# Patient Record
Sex: Male | Born: 1990 | Race: Black or African American | Hispanic: No | Marital: Single | Smoking: Current every day smoker
Health system: Southern US, Community
[De-identification: ages and names within clinical notes are randomized; demographics above are authoritative.]

## PROBLEM LIST (undated history)

## (undated) DIAGNOSIS — J45909 Unspecified asthma, uncomplicated: Secondary | ICD-10-CM

## (undated) DIAGNOSIS — M5020 Other cervical disc displacement, unspecified cervical region: Secondary | ICD-10-CM

---
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
## 2011-07-22 ENCOUNTER — Emergency Department: Payer: Self-pay | Admitting: *Deleted

## 2014-08-01 ENCOUNTER — Emergency Department: Payer: BLUE CROSS/BLUE SHIELD

## 2014-08-01 ENCOUNTER — Encounter: Payer: Self-pay | Admitting: Emergency Medicine

## 2014-08-01 ENCOUNTER — Emergency Department
Admission: EM | Admit: 2014-08-01 | Discharge: 2014-08-01 | Disposition: A | Discharge status: Home Health | Payer: BLUE CROSS/BLUE SHIELD | Attending: Emergency Medicine | Admitting: Emergency Medicine

## 2014-08-01 DIAGNOSIS — Y92410 Unspecified street and highway as the place of occurrence of the external cause: Secondary | ICD-10-CM | POA: Insufficient documentation

## 2014-08-01 DIAGNOSIS — S63619A Unspecified sprain of unspecified finger, initial encounter: Secondary | ICD-10-CM

## 2014-08-01 DIAGNOSIS — Y998 Other external cause status: Secondary | ICD-10-CM | POA: Diagnosis not present

## 2014-08-01 DIAGNOSIS — S63614A Unspecified sprain of right ring finger, initial encounter: Secondary | ICD-10-CM | POA: Diagnosis not present

## 2014-08-01 DIAGNOSIS — Y9389 Activity, other specified: Secondary | ICD-10-CM | POA: Diagnosis not present

## 2014-08-01 DIAGNOSIS — S3991XA Unspecified injury of abdomen, initial encounter: Secondary | ICD-10-CM | POA: Diagnosis not present

## 2014-08-01 DIAGNOSIS — S6991XA Unspecified injury of right wrist, hand and finger(s), initial encounter: Secondary | ICD-10-CM | POA: Diagnosis present

## 2014-08-01 LAB — URINALYSIS COMPLETE WITH MICROSCOPIC (ARMC ONLY)
Bacteria, UA: NONE SEEN
Bilirubin Urine: NEGATIVE
GLUCOSE, UA: NEGATIVE mg/dL
Hgb urine dipstick: NEGATIVE
Ketones, ur: NEGATIVE mg/dL
Leukocytes, UA: NEGATIVE
Nitrite: NEGATIVE
Protein, ur: NEGATIVE mg/dL
Specific Gravity, Urine: 1.026 (ref 1.005–1.030)
pH: 6 (ref 5.0–8.0)

## 2014-08-01 MED ORDER — TRAMADOL HCL 50 MG PO TABS
ORAL_TABLET | ORAL | Status: AC
  Administered 2014-08-01: 50 mg via ORAL
  Filled 2014-08-01: qty 1

## 2014-08-01 MED ORDER — TRAMADOL HCL 50 MG PO TABS: 50.0000 mg | ORAL_TABLET | Freq: Four times a day (QID) | ORAL | Status: DC | PRN

## 2014-08-01 MED ORDER — IBUPROFEN 800 MG PO TABS
ORAL_TABLET | ORAL | Status: AC
  Administered 2014-08-01: 800 mg via ORAL
  Filled 2014-08-01: qty 1

## 2014-08-01 MED ORDER — METHOCARBAMOL 500 MG PO TABS
1000.0000 mg | ORAL_TABLET | Freq: Once | ORAL | Status: AC
  Administered 2014-08-01: 1000 mg via ORAL

## 2014-08-01 MED ORDER — METHOCARBAMOL 500 MG PO TABS
ORAL_TABLET | ORAL | Status: AC
  Administered 2014-08-01: 1000 mg via ORAL
  Filled 2014-08-01: qty 2

## 2014-08-01 MED ORDER — METHOCARBAMOL 750 MG PO TABS: 1500.0000 mg | ORAL_TABLET | Freq: Four times a day (QID) | ORAL | Status: DC

## 2014-08-01 MED ORDER — IBUPROFEN 800 MG PO TABS: 800.0000 mg | ORAL_TABLET | Freq: Three times a day (TID) | ORAL | Status: DC | PRN

## 2014-08-01 MED ORDER — TRAMADOL HCL 50 MG PO TABS
50.0000 mg | ORAL_TABLET | Freq: Once | ORAL | Status: AC
  Administered 2014-08-01: 50 mg via ORAL

## 2014-08-01 MED ORDER — IBUPROFEN 800 MG PO TABS
800.0000 mg | ORAL_TABLET | Freq: Once | ORAL | Status: AC
Start: 2014-08-01 — End: 2014-08-01
  Administered 2014-08-01: 800 mg via ORAL
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
## 2014-08-01 NOTE — ED Notes (Signed)
Pt reports taking Advil today around noon. Pt reports that it helped with the swelling of right hand 4th digit. And "mildly" helped the rest of the pain he reports

## 2014-08-01 NOTE — Discharge Instructions (Signed)
Finger Sprain  A finger sprain is a tear in one of the strong, fibrous tissues that connect the bones (ligaments) in your finger. The severity of the sprain depends on how much of the ligament is torn. The tear can be either partial or complete.  CAUSES   Often, sprains are a result of a fall or accident. If you extend your hands to catch an object or to protect yourself, the force of the impact causes the fibers of your ligament to stretch too much. This excess tension causes the fibers of your ligament to tear.  SYMPTOMS   You may have some loss of motion in your finger. Other symptoms include:   Bruising.   Tenderness.   Swelling.  DIAGNOSIS   In order to diagnose finger sprain, your caregiver will physically examine your finger or thumb to determine how torn the ligament is. Your caregiver may also suggest an X-ray exam of your finger to make sure no bones are broken.  TREATMENT   If your ligament is only partially torn, treatment usually involves keeping the finger in a fixed position (immobilization) for a short period. To do this, your caregiver will apply a bandage, cast, or splint to keep your finger from moving until it heals. For a partially torn ligament, the healing process usually takes 2 to 3 weeks.  If your ligament is completely torn, you may need surgery to reconnect the ligament to the bone. After surgery a cast or splint will be applied and will need to stay on your finger or thumb for 4 to 6 weeks while your ligament heals.  HOME CARE INSTRUCTIONS   Keep your injured finger elevated, when possible, to decrease swelling.   To ease pain and swelling, apply ice to your joint twice a day, for 2 to 3 days:   Put ice in a plastic bag.   Place a towel between your skin and the bag.   Leave the ice on for 15 minutes.   Only take over-the-counter or prescription medicine for pain as directed by your caregiver.   Do not wear rings on your injured finger.   Do not leave your finger unprotected  until pain and stiffness go away (usually 3 to 4 weeks).   Do not allow your cast or splint to get wet. Cover your cast or splint with a plastic bag when you shower or bathe. Do not swim.   Your caregiver may suggest special exercises for you to do during your recovery to prevent or limit permanent stiffness.  SEEK IMMEDIATE MEDICAL CARE IF:   Your cast or splint becomes damaged.   Your pain becomes worse rather than better.  MAKE SURE YOU:   Understand these instructions.   Will watch your condition.   Will get help right away if you are not doing well or get worse.  Document Released: 04/03/2004 Document Revised: 05/19/2011 Document Reviewed: 10/28/2010  [employer] Patient Information 2015 [employer]. This information is not intended to replace advice given to you by your health care provider. Make sure you discuss any questions you have with your health care provider.
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
## 2015-03-14 ENCOUNTER — Emergency Department: Payer: BLUE CROSS/BLUE SHIELD

## 2015-03-14 ENCOUNTER — Encounter: Payer: Self-pay | Admitting: Emergency Medicine

## 2015-03-14 ENCOUNTER — Emergency Department
Admission: EM | Admit: 2015-03-14 | Discharge: 2015-03-14 | Disposition: A | Discharge status: Home / Self Care | Payer: BLUE CROSS/BLUE SHIELD | Attending: Emergency Medicine | Admitting: Emergency Medicine

## 2015-03-14 DIAGNOSIS — Y9339 Activity, other involving climbing, rappelling and jumping off: Secondary | ICD-10-CM | POA: Insufficient documentation

## 2015-03-14 DIAGNOSIS — Y9289 Other specified places as the place of occurrence of the external cause: Secondary | ICD-10-CM | POA: Insufficient documentation

## 2015-03-14 DIAGNOSIS — Y998 Other external cause status: Secondary | ICD-10-CM | POA: Insufficient documentation

## 2015-03-14 DIAGNOSIS — W228XXA Striking against or struck by other objects, initial encounter: Secondary | ICD-10-CM | POA: Insufficient documentation

## 2015-03-14 DIAGNOSIS — S90112A Contusion of left great toe without damage to nail, initial encounter: Secondary | ICD-10-CM | POA: Insufficient documentation

## 2015-03-14 DIAGNOSIS — S99922A Unspecified injury of left foot, initial encounter: Secondary | ICD-10-CM | POA: Diagnosis present

## 2015-03-14 DIAGNOSIS — S9032XA Contusion of left foot, initial encounter: Secondary | ICD-10-CM

## 2015-03-14 MED ORDER — TRAMADOL HCL 50 MG PO TABS
50.0000 mg | ORAL_TABLET | Freq: Four times a day (QID) | ORAL | Status: DC | PRN
Start: 2015-03-14 — End: 2019-05-17

## 2015-03-14 MED ORDER — MELOXICAM 15 MG PO TABS: 15.0000 mg | ORAL_TABLET | Freq: Every day | ORAL | Status: DC

## 2015-03-14 NOTE — ED Notes (Signed)
Informed patient to follow up with Dr. Orland Jarredroxler if not improving.

## 2015-03-14 NOTE — Discharge Instructions (Signed)

## 2015-03-14 NOTE — ED Provider Notes (Signed)
[facility] Emergency Department Provider Note ____________________________________________  Time seen: Approximately 7:23 PM  I have reviewed the triage vital signs and the nursing notes.   HISTORY  Chief Complaint Foot Pain   HPI Cesar Mann is a 25 y.o. male presents to the emergency department for evaluation of left foot pain. He states that he was trying to climb up into the back of a truck while helping his grandparents move and he hit his foot on the bed of the truck causing his left great toe to "bend funny." He states that he has applied a compression bandage and elevated for the past 2 days without relief. He has not been taking any type of pain medications. Movement is the only thing that makes the pain worse.   History reviewed. No pertinent past medical history.  There are no active problems to display for this patient.   History reviewed. No pertinent past surgical history.  Current Outpatient Rx  Name  Route  Sig  Dispense  Refill  . meloxicam (MOBIC) 15 MG tablet   Oral   Take 1 tablet (15 mg total) by mouth daily.   30 tablet   2   . traMADol (ULTRAM) 50 MG tablet   Oral   Take 1 tablet (50 mg total) by mouth every 6 (six) hours as needed.   9 tablet   0     Allergies Sulfa antibiotics  No family history on file.  Social History Social History  Substance Use Topics  . Smoking status: Never Smoker   . Smokeless tobacco: None  . Alcohol Use: No    Review of Systems Constitutional: Normal appetite Eyes: No visual changes. ENT: Normal hearing, no bleeding, denies sore throat. Cardiovascular: Negative for chest pain. Respiratory: Negative for shortness of breath. Gastrointestinal: Negative for for abdominal pain Genitourinary: Negative for dysuria. Musculoskeletal: Positive for pain in the right great toe Skin: Positive for bruising and swelling to the left great Neurological: Negative for headaches. Negative for  focal weakness or numbness.. 10-point ROS otherwise negative.  ____________________________________________   PHYSICAL EXAM:  VITAL SIGNS: ED Triage Vitals  Enc Vitals Group     BP 03/14/15 1755 139/78 mmHg     Pulse Rate 03/14/15 1755 86     Resp 03/14/15 1755 16     Temp 03/14/15 1755 98.8 F (37.1 C)     Temp Source 03/14/15 1755 Oral     SpO2 03/14/15 1755 97 %     Weight 03/14/15 1755 210 lb (95.255 kg)     Height 03/14/15 1755 5\' 11"  (1.803 m)     Head Cir --      Peak Flow --      Pain Score 03/14/15 1755 8     Pain Loc --      Pain Edu? --      Excl. in GC? --     Constitutional: Alert and oriented. Well appearing and in no acute distress. Eyes: Conjunctivae are normal. PERRL. EOMI. Head: Atraumatic. Nose: No congestion/rhinnorhea. Mouth/Throat: Mucous membranes are moist.  Oropharynx non-erythematous. Neck: No stridor.  Cardiovascular: Normal rate, regular rhythm. Grossly normal heart sounds.  Good peripheral circulation. Respiratory: Normal respiratory effort.  No retractions. Lungs CTAB. Gastrointestinal: Soft and nontender. No distention. No abdominal bruits. Genitourinary: Soft Musculoskeletal: Contusion and swelling noted at the MCP of the left great toe. Neurologic:  Normal speech and language. No gross focal neurologic deficits are appreciated. Speech is normal. No gait instability. GCS:  15. Skin:  Skin is warm, dry and intact. No rash noted. Psychiatric: Mood and affect are normal. Speech, behavior, and judgement are normal.  ____________________________________________   LABS (all labs ordered are listed, but only abnormal results are displayed)  Labs Reviewed - No data to display ____________________________________________  EKG   ____________________________________________  RADIOLOGY  Left foot negative for acute bony abnormality.  I, Kem Boroughsari Rhen Kawecki, personally viewed and evaluated these images (plain radiographs) as part of my medical  decision making, as well as reviewing the written report by the radiologist.  ____________________________________________   PROCEDURES  Procedure(s) performed: Great toe and left second toe buddy taped together by ER tech. Neurovascularly intact post-application.  Critical Care performed: No  ____________________________________________   INITIAL IMPRESSION / ASSESSMENT AND PLAN / ED COURSE  Pertinent labs & imaging results that were available during my care of the patient were reviewed by me and considered in my medical decision making (see chart for details).  Patient was instructed to follow-up with Dr. Orland Jarredroxler for symptoms that are not improving over the week. He was advised to ice and elevate the foot for the next few days. He was advised to take the meloxicam and tramadol as prescribed. He was advised to return to the emergency department for symptoms that change or worsen if he is unable schedule appointment. ____________________________________________   FINAL CLINICAL IMPRESSION(S) / ED DIAGNOSES  Final diagnoses:  Foot contusion, left, initial encounter      Chinita PesterCari B Oyinkansola Truax, FNP 03/14/15 1929  Rockne MenghiniAnne-Caroline Norman, MD 03/14/15 2311

## 2015-03-14 NOTE — ED Notes (Signed)
Pt reports trying to climb on something a few days ago, reports left foot pain continues. Pt with swelling noted to left big toe.

## 2015-04-24 ENCOUNTER — Emergency Department
Admission: EM | Admit: 2015-04-24 | Discharge: 2015-04-25 | Disposition: A | Discharge status: Home / Self Care | Payer: BLUE CROSS/BLUE SHIELD | Attending: Emergency Medicine | Admitting: Emergency Medicine

## 2015-04-24 ENCOUNTER — Encounter: Payer: Self-pay | Admitting: Emergency Medicine

## 2015-04-24 DIAGNOSIS — K529 Noninfective gastroenteritis and colitis, unspecified: Secondary | ICD-10-CM | POA: Insufficient documentation

## 2015-04-24 DIAGNOSIS — R112 Nausea with vomiting, unspecified: Secondary | ICD-10-CM | POA: Diagnosis present

## 2015-04-24 DIAGNOSIS — Z791 Long term (current) use of non-steroidal anti-inflammatories (NSAID): Secondary | ICD-10-CM | POA: Insufficient documentation

## 2015-04-24 MED ORDER — ONDANSETRON HCL 4 MG PO TABS: 4.0000 mg | ORAL_TABLET | Freq: Every day | ORAL | Status: AC | PRN

## 2015-04-24 MED ORDER — ONDANSETRON 4 MG PO TBDP
4.0000 mg | ORAL_TABLET | Freq: Once | ORAL | Status: AC
  Administered 2015-04-25: 4 mg via ORAL
  Filled 2015-04-24: qty 1

## 2015-04-24 NOTE — ED Notes (Signed)
Pt arrived to the ED for complaints of vomiting and diarrhea for 2 days. Pt is AOx4 in no apparent distress.

## 2015-04-25 NOTE — ED Provider Notes (Signed)
[facility] Emergency Department Provider Note  ____________________________________________  Time seen: Approximately 12:04 AM  I have reviewed the triage vital signs and the nursing notes.   HISTORY  Chief Complaint Emesis and Diarrhea    HPI Cesar Mann is a 25 y.o. male who presents with acute nausea, vomiting, diarrhea starting last night. Has not vomited in over 6 hours. Tolerating by mouth fluids. No headache. No fevers or chills. No abdominal pain. No bleeding correct him. No chest pain or shortness of breath. Other family members have similar symptoms.   History reviewed. No pertinent past medical history.  There are no active problems to display for this patient.   History reviewed. No pertinent past surgical history.  Current Outpatient Rx  Name  Route  Sig  Dispense  Refill  . meloxicam (MOBIC) 15 MG tablet   Oral   Take 1 tablet (15 mg total) by mouth daily.   30 tablet   2   . ondansetron (ZOFRAN) 4 MG tablet   Oral   Take 1 tablet (4 mg total) by mouth daily as needed for nausea or vomiting.   10 tablet   1   . traMADol (ULTRAM) 50 MG tablet   Oral   Take 1 tablet (50 mg total) by mouth every 6 (six) hours as needed.   9 tablet   0     Allergies Sulfa antibiotics  History reviewed. No pertinent family history.  Social History Social History  Substance Use Topics  . Smoking status: Never Smoker   . Smokeless tobacco: None  . Alcohol Use: No    Review of Systems Constitutional: No fever/chills Eyes: No visual changes. ENT: No sore throat. Cardiovascular: Denies chest pain. Respiratory: Denies shortness of breath. Gastrointestinal: per HPI Genitourinary: Negative for dysuria. Musculoskeletal: Negative for back pain. Skin: Negative for rash. Neurological: Negative for headaches, focal weakness or numbness. 10-point ROS otherwise negative.  ____________________________________________   PHYSICAL  EXAM:  VITAL SIGNS: ED Triage Vitals  Enc Vitals Group     BP 04/24/15 2218 127/66 mmHg     Pulse --      Resp 04/24/15 2218 18     Temp 04/24/15 2218 98.1 F (36.7 C)     Temp Source 04/24/15 2218 Oral     SpO2 04/24/15 2218 100 %     Weight 04/24/15 2218 205 lb (92.987 kg)     Height 04/24/15 2218  (1.803 m)     Head Cir --      Peak Flow --      Pain Score 04/24/15 2219 0     Pain Loc --      Pain Edu? --      Excl. in GC? --     Constitutional: Alert and oriented. Well appearing and in no acute distress. Eyes: Conjunctivae are normal. PERRL. EOMI. Ears:  Clear with normal landmarks. No erythema. Head: Atraumatic. Nose: No congestion/rhinnorhea. Mouth/Throat: Mucous membranes are moist.  Oropharynx non-erythematous. No lesions. Neck:  Supple.  No adenopathy.   Cardiovascular: Normal rate, regular rhythm. Grossly normal heart sounds.  Good peripheral circulation. Respiratory: Normal respiratory effort.  No retractions. Lungs CTAB. Gastrointestinal: Soft and nontender. No distention. No abdominal bruits. No CVA tenderness. Musculoskeletal: Nml ROM of upper and lower extremity joints. Neurologic:  Normal speech and language. No gross focal neurologic deficits are appreciated. No gait instability. Skin:  Skin is warm, dry and intact. No rash noted. Psychiatric: Mood and affect are normal. Speech  and behavior are normal.  ____________________________________________   LABS (all labs ordered are listed, but only abnormal results are displayed)  Labs Reviewed - No data to display ____________________________________________  EKG   ____________________________________________  RADIOLOGY   ____________________________________________   PROCEDURES  Procedure(s) performed: None  Critical Care performed: No  ____________________________________________   INITIAL IMPRESSION / ASSESSMENT AND PLAN / ED COURSE  Pertinent labs & imaging results that were  available during my care of the patient were reviewed by me and considered in my medical decision making (see chart for details).  25 year old male with symptoms of gastroenteritis over the last 24 hours. Symptoms improving. Tolerating liquids. Given Zofran as needed. Also important note. He should continue to improve, or return to the emergency room as needed. Discussed bland diet until symptoms resolve. ____________________________________________   FINAL CLINICAL IMPRESSION(S) / ED DIAGNOSES  Final diagnoses:  Gastroenteritis      Ignacia Bayley, PA-C 04/25/15 0006  Sharman Cheek, MD 04/29/15 [phone]

## 2015-04-25 NOTE — Discharge Instructions (Signed)
Continued to stay on a liquid diet, mostly soups, toast until symptoms resolve. Stay away from dairy. Use nausea medicine as needed.

## 2015-06-18 ENCOUNTER — Encounter: Payer: Self-pay | Admitting: Emergency Medicine

## 2015-06-18 ENCOUNTER — Emergency Department
Admission: EM | Admit: 2015-06-18 | Discharge: 2015-06-18 | Disposition: A | Discharge status: Home / Self Care | Payer: BLUE CROSS/BLUE SHIELD | Attending: Emergency Medicine | Admitting: Emergency Medicine

## 2015-06-18 DIAGNOSIS — J45909 Unspecified asthma, uncomplicated: Secondary | ICD-10-CM | POA: Diagnosis not present

## 2015-06-18 DIAGNOSIS — R509 Fever, unspecified: Secondary | ICD-10-CM | POA: Diagnosis present

## 2015-06-18 DIAGNOSIS — J111 Influenza due to unidentified influenza virus with other respiratory manifestations: Secondary | ICD-10-CM | POA: Diagnosis not present

## 2015-06-18 HISTORY — DX: Unspecified asthma, uncomplicated: J45.909

## 2015-06-18 MED ORDER — PROMETHAZINE-DM 6.25-15 MG/5ML PO SYRP: 5.0000 mL | ORAL_SOLUTION | Freq: Four times a day (QID) | ORAL | Status: DC | PRN

## 2015-06-18 MED ORDER — OSELTAMIVIR PHOSPHATE 75 MG PO CAPS: 75.0000 mg | ORAL_CAPSULE | Freq: Two times a day (BID) | ORAL | Status: DC

## 2015-06-18 NOTE — ED Provider Notes (Signed)
[facility] Emergency Department Provider Note  ____________________________________________  Time seen: Approximately 12:17 PM  I have reviewed the triage vital signs and the nursing notes.   HISTORY  Chief Complaint Nasal Congestion and Fever    HPI Cesar Mann is a 25 y.o. male , NAD, presents to the emergency department with 3 day history of flulike symptoms. States had onset of fever, chills, body aches and nasal congestion as of Friday evening. States his entire family has had influenza and treated with Tamiflu over the last week. Notes he has had significantly decreased appetite but has been able to drink water and Gatorade without any issue. States he took 2 tablets of Tamiflu on Friday but none since that time. Denies abdominal pain, nausea, vomiting, diarrhea. Has not had any chest pain or back pain. No shortness of breath or wheezing. Has had some cough and chest congestion beginning over the last 24 hours.    Past Medical History  Diagnosis Date  . Asthma     There are no active problems to display for this patient.   History reviewed. No pertinent past surgical history.  Current Outpatient Rx  Name  Route  Sig  Dispense  Refill  . meloxicam (MOBIC) 15 MG tablet   Oral   Take 1 tablet (15 mg total) by mouth daily.   30 tablet   2   . ondansetron (ZOFRAN) 4 MG tablet   Oral   Take 1 tablet (4 mg total) by mouth daily as needed for nausea or vomiting.   10 tablet   1   . oseltamivir (TAMIFLU) 75 MG capsule   Oral   Take 1 capsule (75 mg total) by mouth 2 (two) times daily.   10 capsule   0   . promethazine-dextromethorphan (PROMETHAZINE-DM) 6.25-15 MG/5ML syrup   Oral   Take 5 mLs by mouth 4 (four) times daily as needed for cough.   118 mL   0   . traMADol (ULTRAM) 50 MG tablet   Oral   Take 1 tablet (50 mg total) by mouth every 6 (six) hours as needed.   9 tablet   0     Allergies Sulfa antibiotics  History  reviewed. No pertinent family history.  Social History Social History  Substance Use Topics  . Smoking status: Never Smoker   . Smokeless tobacco: None  . Alcohol Use: No     Review of Systems  Constitutional: Positive fever, chills, decreased appetite. Eyes: No visual changes. No discharge or redness, swelling ENT: Positive nasal congestion, runny nose.  No sore throat or sinus pressure, ear pain. Cardiovascular: No chest pain. Respiratory: Positive chest congestion, cough. No shortness of breath. No wheezing.  Gastrointestinal: No abdominal pain.  No nausea, vomiting.  No diarrhea.   Musculoskeletal: Positive general myalgias. Negative for back pain.  Skin: Negative for rash. Neurological: Negative for headaches, focal weakness or numbness. 10-point ROS otherwise negative.  ____________________________________________   PHYSICAL EXAM:  VITAL SIGNS: ED Triage Vitals  Enc Vitals Group     BP --      Pulse --      Resp --      Temp --      Temp src --      SpO2 --      Weight --      Height --      Head Cir --      Peak Flow --      Pain Score  06/18/15 1213 7     Pain Loc --      Pain Edu? --      Excl. in GC? --      Constitutional: Alert and oriented. Well appearing and in no acute distress. Eyes: Conjunctivae are normal. PERRL. EOMI without pain.  Head: Atraumatic. ENT:      Ears: TMs visualized bilaterally with normal light reflex, no erythema, bulging, perforation or erythema.      Nose: Mild congestion with clear rhinorrhea.      Mouth/Throat: Mucous membranes are moist. Clear postnasal drip. Neck: No stridor. Supple with full range of motion. Hematological/Lymphatic/Immunilogical: No cervical lymphadenopathy. Cardiovascular: Normal rate, regular rhythm. Normal S1 and S2. No murmurs, rubs, gallops. Good peripheral circulation. Respiratory: Normal respiratory effort without tachypnea or retractions. Lungs CTAB with breath sounds noted in all lung  fields. Neurologic:  Normal speech and language. No gross focal neurologic deficits are appreciated.  Skin:  Skin is warm, dry and intact. No rash noted. Psychiatric: Mood and affect are normal. Speech and behavior are normal. Patient exhibits appropriate insight and judgement.   ____________________________________________   LABS  None  ____________________________________________  EKG  None ____________________________________________  RADIOLOGY  None ____________________________________________    PROCEDURES  Procedure(s) performed: None      Medications - No data to display   ____________________________________________   INITIAL IMPRESSION / ASSESSMENT AND PLAN / ED COURSE  Patient's diagnosis is consistent with influenza. Patient will be discharged home with prescriptions for Tamiflu and promethazine DM cough syrup to use as directed. Patient may continue to use Tylenol or ibuprofen over-the-counter as needed for fever or aches. Patient advised to finish all prescriptions as prescribed and to not share medications with others. He is to continue to orally hydrate with water and Gatorade as he currently has been doing. Advised brat diet and increase by mouth intake as tolerated. Patient is to follow up with [facility] if symptoms persist past this treatment course. Patient is given ED precautions to return to the ED for any worsening or new symptoms.    ____________________________________________  FINAL CLINICAL IMPRESSION(S) / ED DIAGNOSES  Final diagnoses:  Influenza      NEW MEDICATIONS STARTED DURING THIS VISIT:  Discharge Medication List as of 06/18/2015 12:44 PM    START taking these medications   Details  oseltamivir (TAMIFLU) 75 MG capsule Take 1 capsule (75 mg total) by mouth 2 (two) times daily., Starting 06/18/2015, Until Discontinued, Print    promethazine-dextromethorphan (PROMETHAZINE-DM) 6.25-15 MG/5ML syrup Take 5 mLs by  mouth 4 (four) times daily as needed for cough., Starting 06/18/2015, Until Discontinued, Print             Hope Pigeon, PA-C 06/18/15 1402  Jene Every, MD 06/18/15 1440

## 2015-06-18 NOTE — Discharge Instructions (Signed)

## 2015-06-18 NOTE — ED Notes (Signed)
Pt c/o flu like symptoms since Friday. Pt states fever, nasal congestion and body aches and  chills. Pt has been around brother who has flu. Pt denies any SOB.

## 2016-03-01 ENCOUNTER — Encounter: Payer: Self-pay | Admitting: Emergency Medicine

## 2016-03-01 ENCOUNTER — Emergency Department
Admission: EM | Admit: 2016-03-01 | Discharge: 2016-03-01 | Disposition: A | Discharge status: Home / Self Care | Payer: BLUE CROSS/BLUE SHIELD | Attending: Emergency Medicine | Admitting: Emergency Medicine

## 2016-03-01 DIAGNOSIS — J45909 Unspecified asthma, uncomplicated: Secondary | ICD-10-CM | POA: Diagnosis not present

## 2016-03-01 DIAGNOSIS — M791 Myalgia, unspecified site: Secondary | ICD-10-CM

## 2016-03-01 DIAGNOSIS — B349 Viral infection, unspecified: Secondary | ICD-10-CM

## 2016-03-01 DIAGNOSIS — Z79899 Other long term (current) drug therapy: Secondary | ICD-10-CM | POA: Insufficient documentation

## 2016-03-01 DIAGNOSIS — R509 Fever, unspecified: Secondary | ICD-10-CM | POA: Diagnosis present

## 2016-03-01 DIAGNOSIS — J029 Acute pharyngitis, unspecified: Secondary | ICD-10-CM | POA: Diagnosis not present

## 2016-03-01 DIAGNOSIS — F17290 Nicotine dependence, other tobacco product, uncomplicated: Secondary | ICD-10-CM | POA: Diagnosis not present

## 2016-03-01 LAB — POCT RAPID STREP A: Streptococcus, Group A Screen (Direct): NEGATIVE

## 2016-03-01 LAB — MONONUCLEOSIS SCREEN: Mono Screen: NEGATIVE

## 2016-03-01 LAB — INFLUENZA PANEL BY PCR (TYPE A & B)
Influenza A By PCR: NEGATIVE
Influenza B By PCR: NEGATIVE

## 2016-03-01 MED ORDER — DEXAMETHASONE SODIUM PHOSPHATE 10 MG/ML IJ SOLN
INTRAMUSCULAR | Status: AC
  Filled 2016-03-01: qty 1

## 2016-03-01 MED ORDER — KETOROLAC TROMETHAMINE 60 MG/2ML IM SOLN: 60.0000 mg | Freq: Once | INTRAMUSCULAR | Status: DC

## 2016-03-01 MED ORDER — TRAMADOL HCL 50 MG PO TABS: 50.0000 mg | ORAL_TABLET | Freq: Four times a day (QID) | ORAL | 0 refills | Status: DC | PRN

## 2016-03-01 MED ORDER — DEXAMETHASONE 10 MG/ML FOR PEDIATRIC ORAL USE
10.0000 mg | Freq: Once | INTRAMUSCULAR | Status: AC
  Administered 2016-03-01: 10 mg via ORAL

## 2016-03-01 MED ORDER — TRAMADOL HCL 50 MG PO TABS: 50.0000 mg | ORAL_TABLET | Freq: Once | ORAL | Status: DC

## 2016-03-01 MED ORDER — IBUPROFEN 800 MG PO TABS
800.0000 mg | ORAL_TABLET | Freq: Once | ORAL | Status: AC
  Administered 2016-03-01: 800 mg via ORAL
  Filled 2016-03-01: qty 1

## 2016-03-01 MED ORDER — ACETAMINOPHEN 325 MG PO TABS
650.0000 mg | ORAL_TABLET | Freq: Once | ORAL | Status: AC
  Administered 2016-03-01: 650 mg via ORAL
  Filled 2016-03-01: qty 2

## 2016-03-01 MED ORDER — TRAMADOL HCL 50 MG PO TABS
50.0000 mg | ORAL_TABLET | Freq: Once | ORAL | Status: AC
  Administered 2016-03-01: 50 mg via ORAL
  Filled 2016-03-01: qty 1

## 2016-03-01 NOTE — ED Triage Notes (Addendum)
Pt presents to ED with fever of 101 for the past 2 days and pain on the right side of his throat. Has been taking otc medications without relief. Pt able to answer questions but reluctant due to his pain. Throat appears red and irritated with enlarged tonsils; right tonsil larger than the left that has darkened area with white patch in the center noted.

## 2016-03-01 NOTE — ED Notes (Signed)
Patient going home with family.

## 2016-03-01 NOTE — ED Provider Notes (Signed)
[facility] Emergency Department Provider Note   ____________________________________________   First MD Initiated Contact with Patient 03/01/16 0149     (approximate)  I have reviewed the triage vital signs and the nursing notes.   HISTORY  Chief Complaint Sore Throat and Fever    HPI Cesar Mann is a 25 y.o. male who comes into the hospital today with a concern for strep throat. The patient has been having some sore throat and fever for the last 2 days. He has been taking Advil for his sore throat.The patient reports that his fever has gone up to 101.5. He denies any cough or runny nose but has had some bodyaches and chills. The patient denies any shortness of breath. He reports that the pain seemed unbearable before he came in. The patient rates his pain currently a 7 out of 10 in intensity. He's had no nausea or vomiting and he's had no abdominal pain. The patient has also had no difficulty breathing but pain with swallowing. The patient is here tonight for evaluation.   Past Medical History:  Diagnosis Date  . Asthma     There are no active problems to display for this patient.   History reviewed. No pertinent surgical history.  Prior to Admission medications   Medication Sig Start Date End Date Taking? Authorizing Provider  meloxicam (MOBIC) 15 MG tablet Take 1 tablet (15 mg total) by mouth daily. 03/14/15   Chinita Pesterari B Triplett, FNP  ondansetron (ZOFRAN) 4 MG tablet Take 1 tablet (4 mg total) by mouth daily as needed for nausea or vomiting. 04/24/15 04/23/16  Ignacia Bayleyobert Tumey, PA-C  oseltamivir (TAMIFLU) 75 MG capsule Take 1 capsule (75 mg total) by mouth 2 (two) times daily. 06/18/15   Jami L Hagler, PA-C  promethazine-dextromethorphan (PROMETHAZINE-DM) 6.25-15 MG/5ML syrup Take 5 mLs by mouth 4 (four) times daily as needed for cough. 06/18/15   Jami L Hagler, PA-C  traMADol (ULTRAM) 50 MG tablet Take 1 tablet (50 mg total) by mouth every 6 (six) hours  as needed. 03/14/15   Chinita Pesterari B Triplett, FNP  traMADol (ULTRAM) 50 MG tablet Take 1 tablet (50 mg total) by mouth every 6 (six) hours as needed. 03/01/16   Rebecka ApleyAllison P Kasen Sako, MD    Allergies Sulfa antibiotics  No family history on file.  Social History Social History  Substance Use Topics  . Smoking status: Current Every Day Smoker    Types: Cigars  . Smokeless tobacco: Never Used  . Alcohol use Yes    Review of Systems Constitutional:  fever/chills Eyes: No visual changes. ENT:  sore throat. Cardiovascular: Denies chest pain. Respiratory: Denies shortness of breath. Gastrointestinal: No abdominal pain.  No nausea, no vomiting.  No diarrhea.  No constipation. Genitourinary: Negative for dysuria. Musculoskeletal: Body aches Skin: Negative for rash. Neurological: Negative for headaches, focal weakness or numbness.  10-point ROS otherwise negative.  ____________________________________________   PHYSICAL EXAM:  VITAL SIGNS: ED Triage Vitals  Enc Vitals Group     BP 03/01/16 0019 134/68     Pulse Rate 03/01/16 0019 86     Resp 03/01/16 0019 16     Temp 03/01/16 0019 (!) 100.4 F (38 C)     Temp Source 03/01/16 0019 Oral     SpO2 03/01/16 0019 99 %     Weight 03/01/16 0020 190 lb (86.2 kg)     Height 03/01/16 0020 5\' 11"  (1.803 m)     Head Circumference --  Peak Flow --      Pain Score 03/01/16 0019 10     Pain Loc --      Pain Edu? --      Excl. in GC? --     Constitutional: Alert and oriented. Well appearing and in Moderate distress. Eyes: Conjunctivae are normal. PERRL. EOMI. Head: Atraumatic. Nose: No congestion/rhinnorhea. Mouth/Throat: Mucous membranes are moist.  Oropharynx some right-sided tonsillar erythema and exudate with some mild swelling but no visible abscess. Neck: No stridor.   Hematological/Lymphatic/Immunilogical: cervical lymphadenopathy. Cardiovascular: Normal rate, regular rhythm. Grossly normal heart sounds.  Good peripheral  circulation. Respiratory: Normal respiratory effort.  No retractions. Lungs CTAB. Gastrointestinal: Soft and nontender. No distention. Positive bowel sounds Musculoskeletal: No lower extremity tenderness nor edema.   Neurologic:  Normal speech and language.  Skin:  Skin is warm, dry and intact. Marland Kitchen. Psychiatric: Mood and affect are normal.  ____________________________________________   LABS (all labs ordered are listed, but only abnormal results are displayed)  Labs Reviewed  CULTURE, GROUP A STREP [facility]  MONONUCLEOSIS SCREEN  INFLUENZA PANEL BY PCR (TYPE A & B, H1N1)  POCT RAPID STREP A   ____________________________________________  EKG  none ____________________________________________  RADIOLOGY  none ____________________________________________   PROCEDURES  Procedure(s) performed: None  Procedures  Critical Care performed: No  ____________________________________________   INITIAL IMPRESSION / ASSESSMENT AND PLAN / ED COURSE  Pertinent labs & imaging results that were available during my care of the patient were reviewed by me and considered in my medical decision making (see chart for details).  This is a 25 year old male who comes into the hospital today with a sore throat. I will give the patient a dose of Decadron as well as some tramadol and Tylenol. The patient's strep test is negative. I will also check a Monospot as well as a flu test. I will then reassess the patient.  Clinical Course    The patient's strep is negative, mononucleosis is negative and the patient flew is negative. After the medication he reports that he feels improved. The patient be discharged home. We will send the strep for culture and should anything return positive he will receive a phone call and a prescription called in. The patient understands this and will be discharged home.  ____________________________________________   FINAL CLINICAL IMPRESSION(S) / ED  DIAGNOSES  Final diagnoses:  Pharyngitis, unspecified etiology  Viral illness  Myalgia      NEW MEDICATIONS STARTED DURING THIS VISIT:  Discharge Medication List as of 03/01/2016  3:50 AM    START taking these medications   Details  !! traMADol (ULTRAM) 50 MG tablet Take 1 tablet (50 mg total) by mouth every 6 (six) hours as needed., Starting Sat 03/01/2016, Print     !! - Potential duplicate medications found. Please discuss with provider.       Note:  This document was prepared using Dragon voice recognition software and may include unintentional dictation errors.    Rebecka ApleyAllison P Math Brazie, MD 03/01/16 [phone]

## 2016-03-03 LAB — CULTURE, GROUP A STREP (THRC)

## 2018-08-04 ENCOUNTER — Other Ambulatory Visit: Payer: Self-pay

## 2018-08-04 ENCOUNTER — Encounter: Payer: Self-pay | Admitting: Emergency Medicine

## 2018-08-04 DIAGNOSIS — J45909 Unspecified asthma, uncomplicated: Secondary | ICD-10-CM | POA: Diagnosis not present

## 2018-08-04 DIAGNOSIS — Y93I9 Activity, other involving external motion: Secondary | ICD-10-CM | POA: Diagnosis not present

## 2018-08-04 DIAGNOSIS — S199XXA Unspecified injury of neck, initial encounter: Secondary | ICD-10-CM | POA: Diagnosis not present

## 2018-08-04 DIAGNOSIS — F17210 Nicotine dependence, cigarettes, uncomplicated: Secondary | ICD-10-CM | POA: Insufficient documentation

## 2018-08-04 DIAGNOSIS — Y92410 Unspecified street and highway as the place of occurrence of the external cause: Secondary | ICD-10-CM | POA: Diagnosis not present

## 2018-08-04 DIAGNOSIS — Y998 Other external cause status: Secondary | ICD-10-CM | POA: Insufficient documentation
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
## 2018-08-05 ENCOUNTER — Emergency Department: Payer: 59

## 2018-08-05 ENCOUNTER — Emergency Department
Admission: EM | Admit: 2018-08-05 | Discharge: 2018-08-05 | Disposition: A | Discharge status: Home / Self Care | Payer: 59 | Attending: Emergency Medicine | Admitting: Emergency Medicine

## 2018-08-05 DIAGNOSIS — M7918 Myalgia, other site: Secondary | ICD-10-CM

## 2018-08-05 DIAGNOSIS — M542 Cervicalgia: Secondary | ICD-10-CM

## 2018-08-05 MED ORDER — OXYCODONE-ACETAMINOPHEN 5-325 MG PO TABS
1.0000 | ORAL_TABLET | Freq: Once | ORAL | Status: AC
  Administered 2018-08-05: 1 via ORAL
  Filled 2018-08-05: qty 1

## 2018-08-05 MED ORDER — CYCLOBENZAPRINE HCL 10 MG PO TABS: 10.0000 mg | ORAL_TABLET | Freq: Three times a day (TID) | ORAL | 0 refills | Status: DC | PRN
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
## 2019-05-17 ENCOUNTER — Emergency Department
Admission: EM | Admit: 2019-05-17 | Discharge: 2019-05-17 | Disposition: A | Discharge status: Home / Self Care | Payer: 59 | Attending: Emergency Medicine | Admitting: Emergency Medicine

## 2019-05-17 ENCOUNTER — Encounter: Payer: Self-pay | Admitting: Emergency Medicine

## 2019-05-17 ENCOUNTER — Other Ambulatory Visit: Payer: Self-pay

## 2019-05-17 DIAGNOSIS — W228XXA Striking against or struck by other objects, initial encounter: Secondary | ICD-10-CM | POA: Insufficient documentation

## 2019-05-17 DIAGNOSIS — Y999 Unspecified external cause status: Secondary | ICD-10-CM | POA: Insufficient documentation

## 2019-05-17 DIAGNOSIS — S61212A Laceration without foreign body of right middle finger without damage to nail, initial encounter: Secondary | ICD-10-CM | POA: Insufficient documentation

## 2019-05-17 DIAGNOSIS — F17200 Nicotine dependence, unspecified, uncomplicated: Secondary | ICD-10-CM | POA: Insufficient documentation

## 2019-05-17 DIAGNOSIS — J45909 Unspecified asthma, uncomplicated: Secondary | ICD-10-CM | POA: Insufficient documentation

## 2019-05-17 DIAGNOSIS — Y9389 Activity, other specified: Secondary | ICD-10-CM | POA: Insufficient documentation

## 2019-05-17 DIAGNOSIS — Y929 Unspecified place or not applicable: Secondary | ICD-10-CM | POA: Insufficient documentation

## 2019-05-17 MED ORDER — LIDOCAINE HCL (PF) 1 % IJ SOLN
5.0000 mL | Freq: Once | INTRAMUSCULAR | Status: AC
  Administered 2019-05-17: 5 mL
  Filled 2019-05-17: qty 5

## 2019-05-17 MED ORDER — TETANUS-DIPHTH-ACELL PERTUSSIS 5-2.5-18.5 LF-MCG/0.5 IM SUSP
0.5000 mL | Freq: Once | INTRAMUSCULAR | Status: AC
Start: 2019-05-17 — End: 2019-05-17
  Administered 2019-05-17: 11:00:00 0.5 mL via INTRAMUSCULAR
  Filled 2019-05-17: qty 0.5

## 2019-05-17 MED ORDER — CEPHALEXIN 500 MG PO CAPS: 500.0000 mg | ORAL_CAPSULE | Freq: Three times a day (TID) | ORAL | 0 refills | Status: DC
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
## 2019-05-17 NOTE — Discharge Instructions (Signed)
Follow-up with your primary care provider or urgent care for suture removal in approximately 12 days.  Keep the area clean and dry.  Clean daily with mild soap and water.  Watch for any signs of infection.  Begin taking Keflex 500 mg 3 times daily for the next 5 days for infection prevention.  You may take Tylenol or ibuprofen as needed for pain.  If the dressing gets dirty change the dressing as soon as possible.  Turn to the emergency department if any severe worsening of your symptoms or urgent concerns.

## 2019-05-17 NOTE — ED Provider Notes (Signed)
[facility] Emergency Department Provider Note  ____________________________________________   First MD Initiated Contact with Patient 05/17/19 [phone]     (approximate)  I have reviewed the triage vital signs and the nursing notes.   HISTORY  Chief Complaint Laceration   HPI Cesar Mann is a 29 y.o. male presents to the ED with complaint of laceration to his second and third finger last night approximately 10 PM.  Patient states that he was helping someone move some furniture and hit his hand.  He states that he did not know that it was "bad".  Patient reports that he thinks that he has had a tetanus within the last 5 years however mother called and said that he has not and that he does need a tetanus.  He rates pain as 4 out of 10.  Initially he wanted Steri-Strips because he is "scared of needles".     Past Medical History:  Diagnosis Date  . Asthma     There are no problems to display for this patient.   History reviewed. No pertinent surgical history.  Prior to Admission medications   Medication Sig Start Date End Date Taking? Authorizing Provider  cephALEXin (KEFLEX) 500 MG capsule Take 1 capsule (500 mg total) by mouth 3 (three) times daily. 05/17/19   Tommi Rumps, PA-C    Allergies Sulfa antibiotics  No family history on file.  Social History Social History   Tobacco Use  . Smoking status: Current Every Day Smoker    Types: Cigars  . Smokeless tobacco: Never Used  Substance Use Topics  . Alcohol use: Yes  . Drug use: Yes    Types: Marijuana    Review of Systems Constitutional: No fever/chills Cardiovascular: Denies chest pain. Respiratory: Denies shortness of breath. Musculoskeletal: Negative for back pain. Skin: Laceration right third digit. Neurological: Negative for headaches, focal weakness or numbness. ___________________________________________   PHYSICAL EXAM:  VITAL SIGNS: ED Triage Vitals  Enc Vitals  Group     BP 05/17/19 0848 115/60     Pulse Rate 05/17/19 0848 77     Resp 05/17/19 0848 18     Temp 05/17/19 0848 97.7 F (36.5 C)     Temp Source 05/17/19 0848 Oral     SpO2 05/17/19 0848 98 %     Weight 05/17/19 0840 195 lb (88.5 kg)     Height 05/17/19 0840 5\' 11"  (1.803 m)     Head Circumference --      Peak Flow --      Pain Score 05/17/19 0840 4     Pain Loc --      Pain Edu? --      Excl. in GC? --     Constitutional: Alert and oriented. Well appearing and in no acute distress. Eyes: Conjunctivae are normal. PERRL. EOMI. Head: Atraumatic. Neck: No stridor.   Cardiovascular: Normal rate, regular rhythm. Grossly normal heart sounds.  Good peripheral circulation. Respiratory: Normal respiratory effort.  No retractions. Lungs CTAB. Musculoskeletal: Right hand dorsal aspect there is a very superficial laceration without blood or foreign body noted on the second digit.  However on third digit at the PIP joint there is a flap laceration that continues to bleed slightly.  No foreign body was noted.  Patient is able to flex and extend his fingers without any difficulty and motor sensory function was intact.  Capillary refill is less than 3 seconds. Neurologic:  Normal speech and language. No gross focal neurologic deficits  are appreciated. No gait instability. Skin:  Skin is warm, dry and intact. No rash noted. Psychiatric: Mood and affect are normal. Speech and behavior are normal.  ____________________________________________   LABS (all labs ordered are listed, but only abnormal results are displayed)  Labs Reviewed - No data to display  PROCEDURES  Procedure(s) performed (including Critical Care):  Marland KitchenMarland KitchenLaceration Repair  Date/Time: 05/17/2019 10:00 AM Performed by: Johnn Hai, PA-C Authorized by: Johnn Hai, PA-C   Consent:    Consent obtained:  Verbal   Consent given by:  Patient   Risks discussed:  Pain, poor wound healing and infection Anesthesia (see  MAR for exact dosages):    Anesthesia method:  Nerve block   Block needle gauge:  25 G   Block anesthetic:  Lidocaine 1% w/o epi   Block injection procedure:  Introduced needle, anatomic landmarks identified, incremental injection, anatomic landmarks palpated and negative aspiration for blood   Block outcome:  Anesthesia achieved Laceration details:    Location:  Finger   Finger location:  R long finger   Length (cm):  1.5 Repair type:    Repair type:  Simple Pre-procedure details:    Preparation:  Patient was prepped and draped in usual sterile fashion Exploration:    Hemostasis achieved with:  Direct pressure   Wound exploration: wound explored through full range of motion     Contaminated: no   Treatment:    Area cleansed with:  Saline   Amount of cleaning:  Extensive   Irrigation solution:  Sterile saline   Irrigation volume:  30 cc   Irrigation method:  Pressure wash   Visualized foreign bodies/material removed: no   Skin repair:    Repair method:  Sutures   Suture size:  4-0   Suture material:  Nylon   Suture technique:  Simple interrupted   Number of sutures:  3 Approximation:    Approximation:  Loose Post-procedure details:    Dressing:  Non-adherent dressing   Patient tolerance of procedure:  Tolerated well, no immediate complications   ____________________________________________   INITIAL IMPRESSION / ASSESSMENT AND PLAN / ED COURSE  As part of my medical decision making, I reviewed the following data within the electronic MEDICAL RECORD NUMBER Notes from prior ED visits and [place] Controlled Substance Database  29 year old male presents to the ED with complaint of laceration to his right hand that occurred last evening at 10 PM.  Patient has a very superficial laceration to his index finger however the third digit does have a flap laceration that continues to bleed over his PIP joint.  Patient tolerated suturing.  He is aware that he needs to return or go to his PCP  for suture removal.  A prescription for Keflex was sent to his pharmacy and he is aware that he needs to take this as the laceration has been open for almost 12 hours and is at increased risk for infection.  Patient was made aware that he needs to clean the area daily and watch for any signs of infection. ____________________________________________   FINAL CLINICAL IMPRESSION(S) / ED DIAGNOSES  Final diagnoses:  Laceration of right middle finger without foreign body without damage to nail, initial encounter     ED Discharge Orders         Ordered    cephALEXin (KEFLEX) 500 MG capsule  3 times daily     05/17/19 0918           Note:  This document was  prepared using Conservation officer, historic buildings and may include unintentional dictation errors.    Tommi Rumps, PA-C 05/17/19 1532    Emily Filbert, MD 05/23/19 [phone]
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
## 2021-11-19 ENCOUNTER — Encounter: Payer: Self-pay | Admitting: Emergency Medicine

## 2021-11-19 ENCOUNTER — Ambulatory Visit
Admission: EM | Admit: 2021-11-19 | Discharge: 2021-11-19 | Disposition: A | Discharge status: Home / Self Care | Payer: Managed Care, Other (non HMO) | Attending: Family Medicine | Admitting: Family Medicine

## 2021-11-19 ENCOUNTER — Other Ambulatory Visit: Payer: Self-pay

## 2021-11-19 DIAGNOSIS — S161XXD Strain of muscle, fascia and tendon at neck level, subsequent encounter: Secondary | ICD-10-CM

## 2021-11-19 DIAGNOSIS — S32018D Other fracture of first lumbar vertebra, subsequent encounter for fracture with routine healing: Secondary | ICD-10-CM | POA: Diagnosis not present

## 2021-11-19 DIAGNOSIS — S22019D Unspecified fracture of first thoracic vertebra, subsequent encounter for fracture with routine healing: Secondary | ICD-10-CM | POA: Diagnosis not present

## 2021-11-19 DIAGNOSIS — R519 Headache, unspecified: Secondary | ICD-10-CM | POA: Diagnosis not present

## 2021-11-19 MED ORDER — TIZANIDINE HCL 4 MG PO TABS: 4.0000 mg | ORAL_TABLET | Freq: Four times a day (QID) | ORAL | 0 refills | Status: DC | PRN

## 2021-11-19 MED ORDER — HYDROCODONE-ACETAMINOPHEN 7.5-325 MG PO TABS: 1.0000 | ORAL_TABLET | Freq: Four times a day (QID) | ORAL | 0 refills | Status: AC | PRN

## 2021-11-19 MED ORDER — NAPROXEN 375 MG PO TABS: 375.0000 mg | ORAL_TABLET | Freq: Three times a day (TID) | ORAL | 0 refills | Status: DC
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
## 2021-11-19 NOTE — Discharge Instructions (Addendum)
Call [facility] at [phone] or [phone] to get schedule an appointment.   Stop by the pharmacy to pick up your prescriptions.  Follow up with your primary care provider as needed.  Go to ED for red flag symptoms, including; fevers you cannot reduce with Tylenol/Motrin, severe headaches, vision changes, numbness/weakness in part of the body, lethargy, confusion, intractable vomiting, severe dehydration, chest pain, breathing difficulty, severe persistent abdominal or pelvic pain, signs of severe infection (increased redness, swelling of an area), feeling faint or passing out, dizziness, etc. You should especially go to the ED for sudden acute worsening of condition if you do not elect to go at this time.
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
## 2022-10-21 ENCOUNTER — Other Ambulatory Visit: Payer: Self-pay

## 2022-10-21 ENCOUNTER — Emergency Department
Admission: EM | Admit: 2022-10-21 | Discharge: 2022-10-21 | Disposition: A | Discharge status: Home / Self Care | Payer: Managed Care, Other (non HMO) | Attending: Emergency Medicine | Admitting: Emergency Medicine

## 2022-10-21 DIAGNOSIS — M5416 Radiculopathy, lumbar region: Secondary | ICD-10-CM | POA: Insufficient documentation

## 2022-10-21 MED ORDER — NAPROXEN 500 MG PO TABS: 500.0000 mg | ORAL_TABLET | Freq: Two times a day (BID) | ORAL | 0 refills | Status: AC

## 2022-10-21 MED ORDER — KETOROLAC TROMETHAMINE 15 MG/ML IJ SOLN
15.0000 mg | Freq: Once | INTRAMUSCULAR | Status: AC
  Administered 2022-10-21: 15 mg via INTRAMUSCULAR
  Filled 2022-10-21: qty 1

## 2022-10-21 MED ORDER — PREDNISONE 10 MG (21) PO TBPK: ORAL_TABLET | ORAL | 0 refills | Status: DC

## 2022-10-21 MED ORDER — DEXAMETHASONE SODIUM PHOSPHATE 10 MG/ML IJ SOLN
10.0000 mg | Freq: Once | INTRAMUSCULAR | Status: AC
  Administered 2022-10-21: 10 mg via INTRAMUSCULAR
  Filled 2022-10-21: qty 1

## 2022-10-21 MED ORDER — LIDOCAINE 5 % EX PTCH
1.0000 | MEDICATED_PATCH | CUTANEOUS | Status: DC
  Administered 2022-10-21: 1 via TRANSDERMAL
  Filled 2022-10-21: qty 1

## 2022-10-21 MED ORDER — LIDOCAINE 5 % EX PTCH: 1.0000 | MEDICATED_PATCH | Freq: Two times a day (BID) | CUTANEOUS | 0 refills | Status: AC

## 2022-10-21 NOTE — Discharge Instructions (Addendum)
Please return to the emergency department for any new, worsening, or changing symptoms or other concerns including weakness in your legs, urinary or stool incontinence or retention, numbness or tingling in your extremities/buttocks/groin, fevers, or any other concerns or change in symptoms.  You may follow-up with Ortho or spine surgery if your symptoms persist. It was a pleasure caring for you today.

## 2022-10-21 NOTE — ED Provider Notes (Signed)
[facility] Provider Note    None    (approximate)   History   Hip Pain   HPI  Cesar Mann is a 32 y.o. male who presents today with pain across his low back for the past 3 weeks.  He reports that this occurred after helping somebody move.  Radiates down his right leg to his knee.  He has not had any urinary or fecal incontinence or retention.  No saddle anesthesia.  He still able to ambulate.  No fevers or chills.  No history of IV drug use.  No abdominal pain.  There are no problems to display for this patient.         Physical Exam   Triage Vital Signs: ED Triage Vitals [10/21/22 1559]  Encounter Vitals Group     BP 119/78     Systolic BP Percentile      Diastolic BP Percentile      Pulse Rate 73     Resp 18     Temp 98.1 F (36.7 C)     Temp Source Oral     SpO2 99 %     Weight      Height      Head Circumference      Peak Flow      Pain Score 10     Pain Loc      Pain Education      Exclude from Growth Chart     Most recent vital signs: Vitals:   10/21/22 1559  BP: 119/78  Pulse: 73  Resp: 18  Temp: 98.1 F (36.7 C)  SpO2: 99%    Physical Exam Vitals and nursing note reviewed.  Constitutional:      General: Awake and alert. No acute distress.    Appearance: Normal appearance. The patient is normal weight.  HENT:     Head: Normocephalic and atraumatic.     Mouth: Mucous membranes are moist.  Eyes:     General: PERRL. Normal EOMs        Right eye: No discharge.        Left eye: No discharge.     Conjunctiva/sclera: Conjunctivae normal.  Cardiovascular:     Rate and Rhythm: Normal rate and regular rhythm.     Pulses: Normal pulses.  Pulmonary:     Effort: Pulmonary effort is normal. No respiratory distress.     Breath sounds: Normal breath sounds.  Abdominal:     Abdomen is soft. There is no abdominal tenderness. No rebound or guarding. No distention. Musculoskeletal:        General: No swelling. Normal  range of motion.     Cervical back: Normal range of motion and neck supple.  Back: No midline tenderness.  Tenderness to right lumbar paraspinal muscle area without overlying skin changes.  Strength and sensation 5/5 to bilateral lower extremities. Normal great toe extension against resistance. Normal sensation throughout feet. Normal patellar reflexes.  Positive tive SLR and opposite SLR on the right.  Skin:    General: Skin is warm and dry.     Capillary Refill: Capillary refill takes less than 2 seconds.     Findings: No rash.  Neurological:     Mental Status: The patient is awake and alert.      ED Results / Procedures / Treatments   Labs (all labs ordered are listed, but only abnormal results are displayed) Labs Reviewed - No data to display   EKG  RADIOLOGY     PROCEDURES:  Critical Care performed:   Procedures   MEDICATIONS ORDERED IN ED: Medications  lidocaine (LIDODERM) 5 % 1 patch (1 patch Transdermal Patch Applied 10/21/22 1755)  ketorolac (TORADOL) 15 MG/ML injection 15 mg (15 mg Intramuscular Given 10/21/22 1755)  dexamethasone (DECADRON) injection 10 mg (10 mg Intramuscular Given 10/21/22 1754)     IMPRESSION / MDM / ASSESSMENT AND PLAN / ED COURSE  I reviewed the triage vital signs and the nursing notes.   Differential diagnosis includes, but is not limited to, muscle strain, muscle spasm, radiculopathy.  Patient is awake and alert, hemodynamically stable and afebrile.  He has 5 out of 5 strength with intact sensation to extensor hallucis dorsiflexion and plantarflexion of bilateral lower extremities with normal patellar reflexes bilaterally. Most likely etiology at this point is muscle strain vs herniated disc. No red flags to indicate patient is at risk for more auspicious process that would require urgent/emergent spinal imaging or subspecialty evaluation at this time. No major trauma, no midline tenderness, no history or physical exam findings to  suggest cauda equina syndrome or spinal cord compression. No focal neurological deficits on exam. No constitutional symptoms or history of immunosuppression or IVDA to suggest potential for epidural abscess. Not anticoagulated, no history of bleeding diastasis to suggest risk for epidural hematoma. No chronic steroid use or advanced age or history of malignancy to suggest proclivity towards pathological fracture.  No abdominal pain or flank pain to suggest kidney stone, no history of kidney stone.  No fever or dysuria or CVAT to suggest pyelonephritis .  No chest pain, back pain, shortness of breath, neurological deficits, to suggest vascular catastrophe, and pulses are equal in all 4 extremities.  Discussed care instructions and return precautions with patient. Recommended close outpatient follow-up for re-evaluation. Patient agrees with plan of care.  Patient reported significant improvement after the Toradol and Decadron given in the emergency department.  Will discharge patient to take these medications and return for any worsening or different pain or development of any neurologic symptoms. Educated patient regarding expected time course for back pain to improve and recommended very close outpatient follow-up.  Patient requested the information for [facility], therefore this was provided for him.  Patient understands and agrees with plan.  He was discharged in stable condition.  He was offered a work note but declined.   Patient's presentation is most consistent with acute complicated illness / injury requiring diagnostic workup.   FINAL CLINICAL IMPRESSION(S) / ED DIAGNOSES   Final diagnoses:  Lumbar radiculopathy     Rx / DC Orders   ED Discharge Orders          Ordered    predniSONE (STERAPRED UNI-PAK 21 TAB) 10 MG (21) TBPK tablet        10/21/22 1841    naproxen (NAPROSYN) 500 MG tablet  2 times daily with meals        10/21/22 1841    lidocaine (LIDODERM) 5 %  Every 12 hours         10/21/22 1841             Note:  This document was prepared using Dragon voice recognition software and may include unintentional dictation errors.   Jackelyn Hoehn, PA-C 10/21/22 1855    Merwyn Katos, MD 10/21/22 [phone]

## 2022-10-21 NOTE — ED Triage Notes (Signed)
Pt presents to ED with /co of lower back pain. Pt denies injury or trauma. NAD noted. Pt ambulatory with steady gait to triage.

## 2023-04-29 ENCOUNTER — Ambulatory Visit
Admission: EM | Admit: 2023-04-29 | Discharge: 2023-04-29 | Disposition: A | Discharge status: Home / Self Care | Payer: Self-pay | Attending: Family Medicine | Admitting: Family Medicine

## 2023-04-29 DIAGNOSIS — G8929 Other chronic pain: Secondary | ICD-10-CM

## 2023-04-29 DIAGNOSIS — M549 Dorsalgia, unspecified: Secondary | ICD-10-CM

## 2023-04-29 HISTORY — DX: Other cervical disc displacement, unspecified cervical region: M50.20

## 2023-04-29 MED ORDER — NAPROXEN 500 MG PO TABS: 500.0000 mg | ORAL_TABLET | Freq: Three times a day (TID) | ORAL | 0 refills | Status: DC

## 2023-04-29 MED ORDER — PREDNISONE 10 MG (21) PO TBPK: ORAL_TABLET | ORAL | 0 refills | Status: DC

## 2023-04-29 MED ORDER — TIZANIDINE HCL 4 MG PO TABS: 4.0000 mg | ORAL_TABLET | Freq: Four times a day (QID) | ORAL | 0 refills | Status: DC | PRN
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
## 2023-04-29 NOTE — Discharge Instructions (Addendum)
Follow up with [facility] for further evaluation of your back pain.
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
## 2023-06-16 ENCOUNTER — Ambulatory Visit
Admission: EM | Admit: 2023-06-16 | Discharge: 2023-06-16 | Disposition: A | Discharge status: Home / Self Care | Payer: Self-pay | Attending: Physician Assistant | Admitting: Physician Assistant

## 2023-06-16 ENCOUNTER — Encounter: Payer: Self-pay | Admitting: Emergency Medicine

## 2023-06-16 DIAGNOSIS — R112 Nausea with vomiting, unspecified: Secondary | ICD-10-CM

## 2023-06-16 DIAGNOSIS — R197 Diarrhea, unspecified: Secondary | ICD-10-CM

## 2023-06-16 DIAGNOSIS — A084 Viral intestinal infection, unspecified: Secondary | ICD-10-CM

## 2023-06-16 MED ORDER — ONDANSETRON 4 MG PO TBDP: 4.0000 mg | ORAL_TABLET | Freq: Three times a day (TID) | ORAL | 0 refills | Status: AC | PRN

## 2023-06-16 NOTE — ED Triage Notes (Signed)
 Pt presents with nausea that started last night and vomiting and diarrhea that started today.

## 2023-06-16 NOTE — ED Provider Notes (Signed)
 [facility]    CSN: [id number] Arrival date & time: 06/16/23  1854      History   Chief Complaint Chief Complaint  Patient presents with   Nausea   Diarrhea   Emesis    HPI Cesar Mann is a 33 y.o. male presenting for onset of abdominal cramping, nausea/vomiting and diarrhea last night.  Patient works at [employer] and did not feel comfortable working today with the symptoms.  He says to have been sick people at work with similar symptoms.  He denies fever, URI symptoms, urinary symptoms.  Has taken Imodium once but no other meds.  No other concerns.  HPI  Past Medical History:  Diagnosis Date   Asthma    Herniated disc, cervical     There are no active problems to display for this patient.   History reviewed. No pertinent surgical history.     Home Medications    Prior to Admission medications   Medication Sig Start Date End Date Taking? Authorizing Provider  ondansetron (ZOFRAN-ODT) 4 MG disintegrating tablet Take 1 tablet (4 mg total) by mouth every 8 (eight) hours as needed. 06/16/23  Yes Shirlee Latch PA-C    Family History History reviewed. No pertinent family history.  Social History Social History   Tobacco Use   Smoking status: Every Day    Types: Cigars   Smokeless tobacco: Never  Vaping Use   Vaping status: Never Used  Substance Use Topics   Alcohol use: Yes   Drug use: Yes    Types: Marijuana     Allergies   Promethazine, Sulfa antibiotics, and Diphenhydramine hcl   Review of Systems Review of Systems  Constitutional:  Positive for appetite change. Negative for fatigue and fever.  HENT:  Negative for congestion, rhinorrhea and sore throat.   Respiratory:  Negative for cough and shortness of breath.   Cardiovascular:  Negative for chest pain.  Gastrointestinal:  Positive for abdominal pain, diarrhea, nausea and vomiting.  Musculoskeletal:  Negative for myalgias.  Neurological:  Negative for dizziness, weakness and  headaches.     Physical Exam Triage Vital Signs ED Triage Vitals  Encounter Vitals Group     BP      Systolic BP Percentile      Diastolic BP Percentile      Pulse      Resp      Temp      Temp src      SpO2      Weight      Height      Head Circumference      Peak Flow      Pain Score      Pain Loc      Pain Education      Exclude from Growth Chart    No data found.  Updated Vital Signs BP 125/77 (BP Location: Right Arm)   Pulse 83   Temp 98.7 F (37.1 C) (Oral)   Resp 16   SpO2 98%    Physical Exam Vitals and nursing note reviewed.  Constitutional:      General: He is not in acute distress.    Appearance: Normal appearance. He is well-developed. He is not ill-appearing.  HENT:     Head: Normocephalic and atraumatic.     Nose: Nose normal.     Mouth/Throat:     Mouth: Mucous membranes are moist.     Pharynx: Oropharynx is clear.  Eyes:  General: No scleral icterus.    Conjunctiva/sclera: Conjunctivae normal.  Cardiovascular:     Rate and Rhythm: Normal rate and regular rhythm.     Heart sounds: Normal heart sounds.  Pulmonary:     Effort: Pulmonary effort is normal. No respiratory distress.     Breath sounds: Normal breath sounds.  Abdominal:     Palpations: Abdomen is soft.     Tenderness: There is no abdominal tenderness. There is no guarding or rebound.  Musculoskeletal:     Cervical back: Neck supple.  Skin:    General: Skin is warm and dry.     Capillary Refill: Capillary refill takes less than 2 seconds.  Neurological:     General: No focal deficit present.     Mental Status: He is alert. Mental status is at baseline.     Motor: No weakness.     Gait: Gait normal.  Psychiatric:        Mood and Affect: Mood normal.        Behavior: Behavior normal.      UC Treatments / Results  Labs (all labs ordered are listed, but only abnormal results are displayed) Labs Reviewed - No data to display  EKG   Radiology No results  found.  Procedures Procedures (including critical care time)  Medications Ordered in UC Medications - No data to display  Initial Impression / Assessment and Plan / UC Course  I have reviewed the triage vital signs and the nursing notes.  Pertinent labs & imaging results that were available during my care of the patient were reviewed by me and considered in my medical decision making (see chart for details).     *** Final Clinical Impressions(s) / UC Diagnoses   Final diagnoses:  Viral gastroenteritis  Nausea vomiting and diarrhea     Discharge Instructions      ABDOMINAL PAIN: You may take Tylenol for pain relief. Use medications as directed including antiemetics and antidiarrheal medications if suggested or prescribed. You should increase fluids and electrolytes as well as rest over these next several days. If you have any questions or concerns, or if your symptoms are not improving or if especially if they acutely worsen, please call or stop back to the clinic immediately and we will be happy to help you or go to the ER   ABDOMINAL PAIN RED FLAGS: Seek immediate further care if: symptoms remain the same or worsen over the next 3-7 days, you are unable to keep fluids down, you see blood or mucus in your stool, you vomit black or dark red material, you have a fever of 101.F or higher, you have localized and/or persistent abdominal pain       ED Prescriptions     Medication Sig Dispense Auth. Provider   ondansetron (ZOFRAN-ODT) 4 MG disintegrating tablet Take 1 tablet (4 mg total) by mouth every 8 (eight) hours as needed. 15 tablet Gareth Morgan      PDMP not reviewed this encounter.

## 2023-06-16 NOTE — Discharge Instructions (Signed)
